# Patient Record
Sex: Female | Born: 1960 | Race: White | Hispanic: No | Marital: Single | Smoking: Never smoker
Health system: Southern US, Community
[De-identification: ages and names within clinical notes are randomized; demographics above are authoritative.]

---

## 2004-07-27 DIAGNOSIS — G4700 Insomnia, unspecified: Secondary | ICD-10-CM | POA: Insufficient documentation

## 2004-07-27 DIAGNOSIS — D649 Anemia, unspecified: Secondary | ICD-10-CM | POA: Insufficient documentation

## 2004-07-27 DIAGNOSIS — G43909 Migraine, unspecified, not intractable, without status migrainosus: Secondary | ICD-10-CM | POA: Insufficient documentation

## 2004-07-27 DIAGNOSIS — M797 Fibromyalgia: Secondary | ICD-10-CM | POA: Insufficient documentation

## 2005-10-21 DIAGNOSIS — L732 Hidradenitis suppurativa: Secondary | ICD-10-CM | POA: Insufficient documentation

## 2009-01-02 DIAGNOSIS — E559 Vitamin D deficiency, unspecified: Secondary | ICD-10-CM | POA: Insufficient documentation

## 2009-01-02 DIAGNOSIS — J45909 Unspecified asthma, uncomplicated: Secondary | ICD-10-CM | POA: Insufficient documentation

## 2009-01-02 DIAGNOSIS — E782 Mixed hyperlipidemia: Secondary | ICD-10-CM | POA: Insufficient documentation

## 2010-09-21 DIAGNOSIS — M170 Bilateral primary osteoarthritis of knee: Secondary | ICD-10-CM | POA: Insufficient documentation

## 2012-12-03 DIAGNOSIS — K2100 Gastro-esophageal reflux disease with esophagitis, without bleeding: Secondary | ICD-10-CM | POA: Insufficient documentation

## 2013-09-16 DIAGNOSIS — F411 Generalized anxiety disorder: Secondary | ICD-10-CM | POA: Insufficient documentation

## 2015-07-03 DIAGNOSIS — J309 Allergic rhinitis, unspecified: Secondary | ICD-10-CM | POA: Insufficient documentation

## 2015-11-17 DIAGNOSIS — E669 Obesity, unspecified: Secondary | ICD-10-CM | POA: Insufficient documentation

## 2016-06-21 DIAGNOSIS — R600 Localized edema: Secondary | ICD-10-CM | POA: Insufficient documentation

## 2016-06-21 DIAGNOSIS — M542 Cervicalgia: Secondary | ICD-10-CM | POA: Insufficient documentation

## 2016-06-21 DIAGNOSIS — K219 Gastro-esophageal reflux disease without esophagitis: Secondary | ICD-10-CM | POA: Insufficient documentation

## 2016-06-21 DIAGNOSIS — D229 Melanocytic nevi, unspecified: Secondary | ICD-10-CM | POA: Insufficient documentation

## 2016-06-21 DIAGNOSIS — F32A Depression, unspecified: Secondary | ICD-10-CM | POA: Insufficient documentation

## 2016-06-21 DIAGNOSIS — I10 Essential (primary) hypertension: Secondary | ICD-10-CM | POA: Insufficient documentation

## 2016-10-07 ENCOUNTER — Ambulatory Visit (INDEPENDENT_AMBULATORY_CARE_PROVIDER_SITE_OTHER): Payer: BLUE CROSS/BLUE SHIELD | Admitting: Sports Medicine

## 2016-10-07 ENCOUNTER — Telehealth: Payer: Self-pay | Admitting: Sports Medicine

## 2016-10-07 DIAGNOSIS — M170 Bilateral primary osteoarthritis of knee: Secondary | ICD-10-CM | POA: Diagnosis not present

## 2016-10-07 DIAGNOSIS — Z9884 Bariatric surgery status: Secondary | ICD-10-CM

## 2016-10-07 MED ORDER — IBUPROFEN 600 MG PO TABS: 600.0000 mg | ORAL_TABLET | Freq: Three times a day (TID) | ORAL | 3 refills | Status: DC | PRN

## 2016-10-07 MED ORDER — IBUPROFEN-FAMOTIDINE 800-26.6 MG PO TABS: 1.0000 | ORAL_TABLET | Freq: Three times a day (TID) | ORAL | 3 refills | Status: DC

## 2016-10-07 MED ORDER — MENTHOL (TOPICAL ANALGESIC) 10 % EX LIQD: CUTANEOUS | 11 refills | Status: DC

## 2016-10-07 MED ORDER — MUSCLE RUB 10-15 % EX CREA: 1.0000 "application " | TOPICAL_CREAM | CUTANEOUS | 11 refills | Status: DC | PRN

## 2016-10-07 NOTE — Assessment & Plan Note (Signed)
She also has morbid obesity, she has a LAP-BAND that is not currently being used, I would like to get her into our general surgeons to see if they can get it refilled.

## 2016-10-07 NOTE — Assessment & Plan Note (Addendum)
Bilateral steroid injections. Duexis We will get her approved for Visco supplementation. She also has morbid obesity, she has a LAP-BAND that is not currently being used, I would like to get her into our general surgeons to see if they can get it refilled.

## 2016-10-07 NOTE — Telephone Encounter (Signed)
Submitted for approval on Orthovisc. Awaiting confirmation.  

## 2016-10-07 NOTE — Progress Notes (Signed)
Subjective:    I'm seeing this patient as a consultation for:  Dr. Elenor Quinones  CC: Bilateral knee pain  HPI: This is a pleasant 56 year old female, she has known an x-ray confirmed bilateral knee osteoarthritis, she has had steroid injections and viscosupplementation in the past which have worked well. She is looking for relief, she would like to go through with viscous supplementation again. She does have a LAP-BAND, but it has not been filled in a long time.  Past medical history:  Negative.  See flowsheet/record as well for more information.  Surgical history: Negative.  See flowsheet/record as well for more information.  Family history: Negative.  See flowsheet/record as well for more information.  Social history: Negative.  See flowsheet/record as well for more information.  Allergies, and medications have been entered into the medical record, reviewed, and no changes needed.   Review of Systems: No headache, visual changes, nausea, vomiting, diarrhea, constipation, dizziness, abdominal pain, skin rash, fevers, chills, night sweats, weight loss, swollen lymph nodes, body aches, joint swelling, muscle aches, chest pain, shortness of breath, mood changes, visual or auditory hallucinations.   Objective:   General: Well Developed, well nourished, and in no acute distress.  Neuro/Psych: Alert and oriented x3, extra-ocular muscles intact, able to move all 4 extremities, sensation grossly intact. Skin: Warm and dry, no rashes noted.  Respiratory: Not using accessory muscles, speaking in full sentences, trachea midline.  Cardiovascular: Pulses palpable, no extremity edema. Abdomen: Does not appear distended. Bilateral knees: Normal to inspection with no erythema or effusion or obvious bony abnormalities. Tender to palpation the medial joint line bilaterally ROM normal in flexion and extension and lower leg rotation. Ligaments with solid consistent endpoints including ACL, PCL,  LCL, MCL. Negative Mcmurray's and provocative meniscal tests. Non painful patellar compression. Patellar and quadriceps tendons unremarkable. Hamstring and quadriceps strength is normal.  Procedure: Real-time Ultrasound Guided Injection of left knee Device: GE Logiq E  Verbal informed consent obtained.  Time-out conducted.  Noted no overlying erythema, induration, or other signs of local infection.  Skin prepped in a sterile fashion.  Local anesthesia: Topical Ethyl chloride.  With sterile technique and under real time ultrasound guidance:  1 mL kenalog 40, 2 mL lidocaine, 2 mL Marcaine injected easily. Completed without difficulty  Pain immediately resolved suggesting accurate placement of the medication.  Advised to call if fevers/chills, erythema, induration, drainage, or persistent bleeding.  Images permanently stored and available for review in the ultrasound unit.  Impression: Technically successful ultrasound guided injection.  Procedure: Real-time Ultrasound Guided Injection of right knee Device: GE Logiq E  Verbal informed consent obtained.  Time-out conducted.  Noted no overlying erythema, induration, or other signs of local infection.  Skin prepped in a sterile fashion.  Local anesthesia: Topical Ethyl chloride.  With sterile technique and under real time ultrasound guidance:  1 mL kenalog 40, 2 mL lidocaine, 2 mL Marcaine injected easily. Completed without difficulty  Pain immediately resolved suggesting accurate placement of the medication.  Advised to call if fevers/chills, erythema, induration, drainage, or persistent bleeding.  Images permanently stored and available for review in the ultrasound unit.  Impression: Technically successful ultrasound guided injection.  Impression and Recommendations:   This case required medical decision making of moderate complexity.  Primary osteoarthritis of both knees Bilateral steroid injections. Duexis We will get her  approved for Visco supplementation. She also has morbid obesity, she has a LAP-BAND that is not currently being used, I would  like to get her into our general surgeons to see if they can get it refilled.  LAP-BAND surgery status She also has morbid obesity, she has a LAP-BAND that is not currently being used, I would like to get her into our general surgeons to see if they can get it refilled.

## 2016-10-07 NOTE — Telephone Encounter (Signed)
-----   Message from Silverio Decamp, MD sent at 10/07/2016  2:16 PM EST ----- Boogs, Bilateral Visco approval, x-ray confirmed arthritis, good response to previous series of viscous supplementation. ___________________________________________ Gwen Her. Dianah Field, M.D., ABFM., CAQSM. Primary Care and [place] Instructor of [place] of [facility] of Medicine

## 2016-10-10 ENCOUNTER — Other Ambulatory Visit: Payer: Self-pay

## 2016-10-10 DIAGNOSIS — M170 Bilateral primary osteoarthritis of knee: Secondary | ICD-10-CM

## 2016-10-10 MED ORDER — MUSCLE RUB 10-15 % EX CREA: 1.0000 "application " | TOPICAL_CREAM | CUTANEOUS | 11 refills | Status: AC | PRN

## 2016-10-10 MED ORDER — MENTHOL (TOPICAL ANALGESIC) 10 % EX LIQD: CUTANEOUS | 11 refills | Status: AC

## 2016-11-03 ENCOUNTER — Telehealth: Payer: Self-pay | Admitting: Sports Medicine

## 2016-11-03 NOTE — Telephone Encounter (Signed)
Spoke with Pt and scheduled first injection.

## 2016-11-03 NOTE — Telephone Encounter (Signed)
Received the following information from OV benefits investigation:   Orthovisc is covered. There is an injection copay of $100.00 and a separate office visit copay of $20 if billed as primary or $40 if billed as specialist visit which will be applied towards the OOP max of $6600.00 in which $338.78 has been met. Deductibles and Co-Insurance do not apply. [name] is allowed. Call reference number is [id number].  Pt called and scheduled.

## 2016-11-03 NOTE — Telephone Encounter (Signed)
Patient called adv that she has left messages with Dr. Darene Lamer nurse regarding her approval for Vaughan Browner and she has not heard anything back. I adv pt that Triage nurse [facility] handles those approvals and will send a message for Kelsi to call her back today to discuss information. Pt was very upset states she is in pain and would like to know what insurance has adv our office but I explained to her that Dr. Mcneil Sober nurse is not the person who takes care of that information, pt understood. Thanks

## 2016-11-04 ENCOUNTER — Ambulatory Visit (INDEPENDENT_AMBULATORY_CARE_PROVIDER_SITE_OTHER): Payer: BLUE CROSS/BLUE SHIELD | Admitting: Sports Medicine

## 2016-11-04 VITALS — BP 124/84 | HR 91 | Resp 18 | Wt 248.2 lb

## 2016-11-04 DIAGNOSIS — M170 Bilateral primary osteoarthritis of knee: Secondary | ICD-10-CM | POA: Diagnosis not present

## 2016-11-04 NOTE — Progress Notes (Signed)

## 2016-11-04 NOTE — Assessment & Plan Note (Signed)
Orthovisc injection #1 of 4 into both knees, return in one week for #2 of 4.

## 2016-11-04 NOTE — Telephone Encounter (Signed)
Pt came into clinic today for first bilat OV injection. At check in, Pt states she "changed insurance to a better plan with [employer]." Advised Pt the benefit quote that was given may not still be valid, Pt verbalized understanding and would like to proceed with injections. Physician advised.

## 2016-11-08 ENCOUNTER — Telehealth: Payer: Self-pay

## 2016-11-08 MED ORDER — DICLOFENAC SODIUM 2 % TD SOLN: 2.0000 | Freq: Two times a day (BID) | TRANSDERMAL | 11 refills | Status: DC

## 2016-11-08 NOTE — Telephone Encounter (Signed)
Pharmacy called stating pt is unable to take pills because they are too large. Pharmacy would like to know if pt can have Pennsaid instead. Please advise.

## 2016-11-08 NOTE — Telephone Encounter (Signed)
Done

## 2016-11-11 ENCOUNTER — Ambulatory Visit (INDEPENDENT_AMBULATORY_CARE_PROVIDER_SITE_OTHER): Payer: BLUE CROSS/BLUE SHIELD | Admitting: Sports Medicine

## 2016-11-11 DIAGNOSIS — M170 Bilateral primary osteoarthritis of knee: Secondary | ICD-10-CM | POA: Diagnosis not present

## 2016-11-11 NOTE — Progress Notes (Signed)

## 2016-11-11 NOTE — Assessment & Plan Note (Signed)
Orthovisc injection #2 of 4 into both knees, return in one week for #3 of 4. 

## 2016-11-21 ENCOUNTER — Ambulatory Visit (INDEPENDENT_AMBULATORY_CARE_PROVIDER_SITE_OTHER): Payer: BLUE CROSS/BLUE SHIELD | Admitting: Sports Medicine

## 2016-11-21 DIAGNOSIS — M170 Bilateral primary osteoarthritis of knee: Secondary | ICD-10-CM

## 2016-11-21 NOTE — Progress Notes (Signed)

## 2016-11-21 NOTE — Assessment & Plan Note (Signed)
Orthovisc injection #3 of 4 into both knees, return in one week for orthovisc #4 of 4

## 2016-11-28 ENCOUNTER — Ambulatory Visit (INDEPENDENT_AMBULATORY_CARE_PROVIDER_SITE_OTHER): Payer: BLUE CROSS/BLUE SHIELD | Admitting: Sports Medicine

## 2016-11-28 VITALS — BP 108/66 | HR 79 | Resp 16 | Wt 239.8 lb

## 2016-11-28 DIAGNOSIS — M170 Bilateral primary osteoarthritis of knee: Secondary | ICD-10-CM | POA: Diagnosis not present

## 2016-11-28 NOTE — Assessment & Plan Note (Signed)
Orthovisc injection #4 of 4 into both knees, return as needed. 

## 2016-11-28 NOTE — Progress Notes (Signed)

## 2016-12-26 ENCOUNTER — Ambulatory Visit (INDEPENDENT_AMBULATORY_CARE_PROVIDER_SITE_OTHER): Payer: BLUE CROSS/BLUE SHIELD | Admitting: Sports Medicine

## 2016-12-26 DIAGNOSIS — M170 Bilateral primary osteoarthritis of knee: Secondary | ICD-10-CM

## 2016-12-26 NOTE — Assessment & Plan Note (Signed)
Doing extremely well after Orthovisc into both knees, also doing well with Duexis and topical Pennsaid. Return as needed.

## 2016-12-26 NOTE — Progress Notes (Signed)
  Subjective:    CC: Follow-up  HPI: Bilateral knee osteoarthritis: Pain is resolved.  Past medical history:  Negative.  See flowsheet/record as well for more information.  Surgical history: Negative.  See flowsheet/record as well for more information.  Family history: Negative.  See flowsheet/record as well for more information.  Social history: Negative.  See flowsheet/record as well for more information.  Allergies, and medications have been entered into the medical record, reviewed, and no changes needed.   Review of Systems: No fevers, chills, night sweats, weight loss, chest pain, or shortness of breath.   Objective:    General: Well Developed, well nourished, and in no acute distress.  Neuro: Alert and oriented x3, extra-ocular muscles intact, sensation grossly intact.  HEENT: Normocephalic, atraumatic, pupils equal round reactive to light, neck supple, no masses, no lymphadenopathy, thyroid nonpalpable.  Skin: Warm and dry, no rashes. Cardiac: Regular rate and rhythm, no murmurs rubs or gallops, no lower extremity edema.  Respiratory: Clear to auscultation bilaterally. Not using accessory muscles, speaking in full sentences.  Impression and Recommendations:    Primary osteoarthritis of both knees Doing extremely well after Orthovisc into both knees, also doing well with Duexis and topical Pennsaid. Return as needed.

## 2017-02-03 ENCOUNTER — Other Ambulatory Visit: Payer: Self-pay | Admitting: Sports Medicine

## 2017-02-03 DIAGNOSIS — M170 Bilateral primary osteoarthritis of knee: Secondary | ICD-10-CM

## 2017-03-10 ENCOUNTER — Other Ambulatory Visit: Payer: Self-pay | Admitting: Sports Medicine

## 2017-03-10 DIAGNOSIS — M170 Bilateral primary osteoarthritis of knee: Secondary | ICD-10-CM

## 2018-02-05 DIAGNOSIS — E039 Hypothyroidism, unspecified: Secondary | ICD-10-CM | POA: Insufficient documentation

## 2018-03-31 ENCOUNTER — Other Ambulatory Visit: Payer: Self-pay | Admitting: Sports Medicine

## 2018-03-31 DIAGNOSIS — M170 Bilateral primary osteoarthritis of knee: Secondary | ICD-10-CM

## 2019-10-11 ENCOUNTER — Ambulatory Visit (INDEPENDENT_AMBULATORY_CARE_PROVIDER_SITE_OTHER): Payer: 59

## 2019-10-11 ENCOUNTER — Ambulatory Visit: Payer: 59 | Admitting: Sports Medicine

## 2019-10-11 ENCOUNTER — Encounter: Payer: Self-pay | Admitting: Sports Medicine

## 2019-10-11 ENCOUNTER — Other Ambulatory Visit: Payer: Self-pay

## 2019-10-11 DIAGNOSIS — M170 Bilateral primary osteoarthritis of knee: Secondary | ICD-10-CM

## 2019-10-11 MED ORDER — DICLOFENAC SODIUM 2 % EX SOLN: 2.0000 | Freq: Two times a day (BID) | CUTANEOUS | 11 refills | Status: AC

## 2019-10-11 NOTE — Assessment & Plan Note (Signed)
I have not seen Jillian Morales in a long time, she did well after Orthovisc as well as Duexis and with Pennsaid (diclofenac 2% topical) approximately 3 years ago. A night ago she rolled over in bed and felt a severe pain on her lateral left knee. I think she has a degenerative meniscal tear, no acute current mechanical symptoms. Injection today with steroid. I am going to give her some Pennsaid samples. Updated x-rays of both knees. Return to see me in 1 month.

## 2019-10-11 NOTE — Progress Notes (Signed)
    Procedures performed today:    Procedure: Real-time Ultrasound Guided injection of the left knee Device: Samsung HS60  Verbal informed consent obtained.  Time-out conducted.  Noted no overlying erythema, induration, or other signs of local infection.  Skin prepped in a sterile fashion.  Local anesthesia: Topical Ethyl chloride.  With sterile technique and under real time ultrasound guidance: 1 cc Kenalog 40, 2 cc lidocaine, 2 cc bupivacaine injected easily Completed without difficulty  Pain immediately resolved suggesting accurate placement of the medication.  Advised to call if fevers/chills, erythema, induration, drainage, or persistent bleeding.  Images permanently stored and available for review in the ultrasound unit.  Impression: Technically successful ultrasound guided injection.  Independent interpretation of tests performed by another provider:   None.  Impression and Recommendations:    Primary osteoarthritis of both knees I have not seen Emmelia in a long time, she did well after Orthovisc as well as Duexis and with Pennsaid (diclofenac 2% topical) approximately 3 years ago. A night ago she rolled over in bed and felt a severe pain on her lateral left knee. I think she has a degenerative meniscal tear, no acute current mechanical symptoms. Injection today with steroid. I am going to give her some Pennsaid samples. Updated x-rays of both knees. Return to see me in 1 month.    ___________________________________________ Gwen Her. Dianah Field, M.D., ABFM., CAQSM. Primary Care and [place] Instructor of [place] of [facility] of Medicine

## 2019-10-15 ENCOUNTER — Telehealth: Payer: Self-pay | Admitting: Sports Medicine

## 2019-10-15 NOTE — Telephone Encounter (Signed)
Received fax for Pennsaid sent through cover my meds waiting on determination. - CF

## 2019-10-21 NOTE — Telephone Encounter (Signed)
Received fax from [facility] they denied coverage on Pennsaid due to not receiving all clinical information to see if medically necessary. Placing in providers box for review. - CF

## 2019-11-08 ENCOUNTER — Ambulatory Visit: Payer: 59 | Admitting: Sports Medicine

## 2019-11-14 ENCOUNTER — Ambulatory Visit (INDEPENDENT_AMBULATORY_CARE_PROVIDER_SITE_OTHER): Payer: 59 | Admitting: Sports Medicine

## 2019-11-14 ENCOUNTER — Other Ambulatory Visit: Payer: Self-pay

## 2019-11-14 DIAGNOSIS — M170 Bilateral primary osteoarthritis of knee: Secondary | ICD-10-CM | POA: Diagnosis not present

## 2019-11-14 NOTE — Progress Notes (Signed)
    Procedures performed today:    Procedure: Real-time Ultrasound Guided injection of the right knee Device: Samsung HS60  Verbal informed consent obtained.  Time-out conducted.  Noted no overlying erythema, induration, or other signs of local infection.  Skin prepped in a sterile fashion.  Local anesthesia: Topical Ethyl chloride.  With sterile technique and under real time ultrasound guidance: 1 cc Kenalog 40, 2 cc lidocaine, 2 cc bupivacaine injected easily Completed without difficulty  Pain immediately resolved suggesting accurate placement of the medication.  Advised to call if fevers/chills, erythema, induration, drainage, or persistent bleeding.  Images permanently stored and available for review in the ultrasound unit.  Impression: Technically successful ultrasound guided injection.  Independent interpretation of notes and tests performed by another provider:   None.  Brief History, Exam, Impression, and Recommendations:    Primary osteoarthritis of both knees Kellis returns, she did extremely well after a left knee injection at the last visit. She is pain-free, now has similar pain in her right knee and would like an injection, this was performed today. She can continue Pennsaid (diclofenac 2% topical). She has a vacation coming up in the fall and would like to finish course of a series before then but she will let us know.    ___________________________________________ Gwen Her. Dianah Field, M.D., ABFM., CAQSM. Primary Care and [place] Instructor of [place] of [facility] of Medicine

## 2019-11-14 NOTE — Assessment & Plan Note (Signed)
Jillian Morales returns, she did extremely well after a left knee injection at the last visit. She is pain-free, now has similar pain in her right knee and would like an injection, this was performed today. She can continue Pennsaid (diclofenac 2% topical). She has a vacation coming up in the fall and would like to finish course of a series before then but she will let us know.

## 2020-01-29 ENCOUNTER — Telehealth: Payer: Self-pay | Admitting: *Deleted

## 2020-01-29 NOTE — Telephone Encounter (Signed)
Routing to [place] to get Orthovisc approved again.  Bilateral, did well in the past, x-ray confirmed and failed steroid injections and NSAIDs.

## 2020-01-29 NOTE — Telephone Encounter (Signed)
Pt left vm wanting to do Orthovisc again.

## 2020-01-29 NOTE — Telephone Encounter (Signed)
Started Orthovisc approval and placed PA form in providers box for signature. - CF

## 2020-01-30 NOTE — Telephone Encounter (Signed)
Faxed PA form to [facility] waiting on determination. - CF

## 2020-03-12 NOTE — Telephone Encounter (Signed)
I called [facility] and spoke with Doren Custard. I had him do the authorization over the phone and we got this approved in about 30 minutes. After speaking with the representative the prior authorization was not sent and there had been no record of the one that had been sent initially in June. The approval is good for 1 year. I am going to send a copy of the approval to scan. FYI. I will call the patient to set up an appointment.   The authorization has been approved after almost 30 minutes on the phone with the representative. The call reference is [id number].

## 2020-03-12 NOTE — Telephone Encounter (Signed)
Patient wanted to know what the status of this approval was. She states its been over a month and no one has contacted her about this.

## 2020-03-12 NOTE — Telephone Encounter (Signed)
I called patient and she has a visit next week with Dr. Darene Lamer to start the Orthovisc injections. No other concerns.

## 2020-03-13 NOTE — Telephone Encounter (Signed)
Jillian Morales!  Killing it!!!

## 2020-03-17 ENCOUNTER — Ambulatory Visit: Payer: 59 | Admitting: Sports Medicine

## 2020-03-30 ENCOUNTER — Other Ambulatory Visit: Payer: Self-pay

## 2020-03-30 ENCOUNTER — Ambulatory Visit (INDEPENDENT_AMBULATORY_CARE_PROVIDER_SITE_OTHER): Payer: 59 | Admitting: Sports Medicine

## 2020-03-30 DIAGNOSIS — M170 Bilateral primary osteoarthritis of knee: Secondary | ICD-10-CM

## 2020-03-30 NOTE — Assessment & Plan Note (Signed)
Orthovisc #1 of 4 both knees, return in 1 week for number 2 of 4. Ok to double book her somewhere.

## 2020-03-30 NOTE — Progress Notes (Signed)
    Procedures performed today:    Procedure: Real-time Ultrasound Guided injection of the left knee Device: Samsung HS60  Verbal informed consent obtained.  Time-out conducted.  Noted no overlying erythema, induration, or other signs of local infection.  Skin prepped in a sterile fashion.  Local anesthesia: Topical Ethyl chloride.  With sterile technique and under real time ultrasound guidance:  30 mg/2 mL of OrthoVisc (sodium hyaluronate) in a prefilled syringe was injected easily into the knee through a 22-gauge needle.   Completed without difficulty  Pain immediately resolved suggesting accurate placement of the medication.  Advised to call if fevers/chills, erythema, induration, drainage, or persistent bleeding.  Images permanently stored and available for review in the ultrasound unit.  Impression: Technically successful ultrasound guided injection.  Procedure: Real-time Ultrasound Guided injection of the right knee Device: Samsung HS60  Verbal informed consent obtained.  Time-out conducted.  Noted no overlying erythema, induration, or other signs of local infection.  Skin prepped in a sterile fashion.  Local anesthesia: Topical Ethyl chloride.  With sterile technique and under real time ultrasound guidance:  30 mg/2 mL of OrthoVisc (sodium hyaluronate) in a prefilled syringe was injected easily into the knee through a 22-gauge needle.   Completed without difficulty  Pain immediately resolved suggesting accurate placement of the medication.  Advised to call if fevers/chills, erythema, induration, drainage, or persistent bleeding.  Images permanently stored and available for review in the ultrasound unit.  Impression: Technically successful ultrasound guided injection.  Independent interpretation of notes and tests performed by another provider:   None.  Brief History, Exam, Impression, and Recommendations:    Primary osteoarthritis of both knees Orthovisc #1 of 4 both  knees, return in 1 week for number 2 of 4. Ok to double book her somewhere.    ___________________________________________ Gwen Her. Dianah Field, M.D., ABFM., CAQSM. Primary Care and [place] Instructor of [place] of [facility] of Medicine

## 2020-04-07 ENCOUNTER — Ambulatory Visit (INDEPENDENT_AMBULATORY_CARE_PROVIDER_SITE_OTHER): Payer: 59 | Admitting: Sports Medicine

## 2020-04-07 DIAGNOSIS — M170 Bilateral primary osteoarthritis of knee: Secondary | ICD-10-CM

## 2020-04-07 NOTE — Assessment & Plan Note (Signed)
Orthovisc No. 2 of 4 into both knees, return in 1 week for #3

## 2020-04-07 NOTE — Progress Notes (Signed)
    Procedures performed today:    Procedure: Real-time Ultrasound Guidedinjection of the left knee Device: Samsung HS60  Verbal informed consent obtained.  Time-out conducted.  Noted no overlying erythema, induration, or other signs of local infection.  Skin prepped in a sterile fashion.  Local anesthesia: Topical Ethyl chloride.  With sterile technique and under real time ultrasound guidance: 30 mg/2 mL of OrthoVisc (sodium hyaluronate) in a prefilled syringe was injected easily into the knee through a 22-gauge needle.   Completed without difficulty  Pain immediately resolved suggesting accurate placement of the medication.  Advised to call if fevers/chills, erythema, induration, drainage, or persistent bleeding.  Images permanently stored and available for review in the ultrasound unit.  Impression: Technically successful ultrasound guided injection.  Procedure: Real-time Ultrasound Guidedinjection of the right knee Device: Samsung HS60  Verbal informed consent obtained.  Time-out conducted.  Noted no overlying erythema, induration, or other signs of local infection.  Skin prepped in a sterile fashion.  Local anesthesia: Topical Ethyl chloride.  With sterile technique and under real time ultrasound guidance: 30 mg/2 mL of OrthoVisc (sodium hyaluronate) in a prefilled syringe was injected easily into the knee through a 22-gauge needle.   Completed without difficulty  Pain immediately resolved suggesting accurate placement of the medication.  Advised to call if fevers/chills, erythema, induration, drainage, or persistent bleeding.  Images permanently stored and available for review in the ultrasound unit.  Impression: Technically successful ultrasound guided injection.  Independent interpretation of notes and tests performed by another provider:   None.  Brief History, Exam, Impression, and Recommendations:    Primary osteoarthritis of both knees Orthovisc No. 2 of 4 into  both knees, return in 1 week for #3    ___________________________________________ Gwen Her. Dianah Field, M.D., ABFM., CAQSM. Primary Care and [place] Instructor of [place] of [facility] of Medicine

## 2020-04-16 ENCOUNTER — Ambulatory Visit (INDEPENDENT_AMBULATORY_CARE_PROVIDER_SITE_OTHER): Payer: 59 | Admitting: Sports Medicine

## 2020-04-16 ENCOUNTER — Ambulatory Visit (INDEPENDENT_AMBULATORY_CARE_PROVIDER_SITE_OTHER): Payer: 59

## 2020-04-16 DIAGNOSIS — M170 Bilateral primary osteoarthritis of knee: Secondary | ICD-10-CM

## 2020-04-16 NOTE — Assessment & Plan Note (Signed)
Orthovisc No. 3 of 4 into both knees, return in 1 week for #4 of 4.

## 2020-04-16 NOTE — Progress Notes (Signed)
° ° °  Procedures performed today:    Procedure: Real-time Ultrasound Guidedinjection of theleft knee Device: Samsung HS60  Verbal informed consent obtained.  Time-out conducted.  Noted no overlying erythema, induration, or other signs of local infection.  Skin prepped in a sterile fashion.  Local anesthesia: Topical Ethyl chloride.  With sterile technique and under real time ultrasound guidance:30 mg/2 mL of OrthoVisc (sodium hyaluronate) in a prefilled syringe was injected easily into the knee through a 22-gauge needle.  Completed without difficulty  Pain immediately resolved suggesting accurate placement of the medication.  Advised to call if fevers/chills, erythema, induration, drainage, or persistent bleeding.  Images permanently stored and available for review in the ultrasound unit.  Impression: Technically successful ultrasound guided injection.  Procedure: Real-time Ultrasound Guidedinjection of theright knee Device: Samsung HS60  Verbal informed consent obtained.  Time-out conducted.  Noted no overlying erythema, induration, or other signs of local infection.  Skin prepped in a sterile fashion.  Local anesthesia: Topical Ethyl chloride.  With sterile technique and under real time ultrasound guidance:30 mg/2 mL of OrthoVisc (sodium hyaluronate) in a prefilled syringe was injected easily into the knee through a 22-gauge needle.  Completed without difficulty  Pain immediately resolved suggesting accurate placement of the medication.  Advised to call if fevers/chills, erythema, induration, drainage, or persistent bleeding.  Images permanently stored and available for review in the ultrasound unit.  Impression: Technically successful ultrasound guided injection.  Independent interpretation of notes and tests performed by another provider:   None.  Brief History, Exam, Impression, and Recommendations:    Primary osteoarthritis of both knees Orthovisc No. 3 of 4 into  both knees, return in 1 week for #4 of 4.    ___________________________________________ Gwen Her. Dianah Field, M.D., ABFM., CAQSM. Primary Care and [place] Instructor of [place] of [facility] of Medicine

## 2020-04-29 ENCOUNTER — Ambulatory Visit (INDEPENDENT_AMBULATORY_CARE_PROVIDER_SITE_OTHER): Payer: 59 | Admitting: Sports Medicine

## 2020-04-29 ENCOUNTER — Ambulatory Visit (INDEPENDENT_AMBULATORY_CARE_PROVIDER_SITE_OTHER): Payer: 59

## 2020-04-29 DIAGNOSIS — M170 Bilateral primary osteoarthritis of knee: Secondary | ICD-10-CM

## 2020-04-29 NOTE — Progress Notes (Signed)
    Procedures performed today:    Procedure: Real-time Ultrasound Guidedinjection of theleft knee Device: Samsung HS60  Verbal informed consent obtained.  Time-out conducted.  Noted no overlying erythema, induration, or other signs of local infection.  Skin prepped in a sterile fashion.  Local anesthesia: Topical Ethyl chloride.  With sterile technique and under real time ultrasound guidance:30 mg/2 mL of OrthoVisc (sodium hyaluronate) in a prefilled syringe was injected easily into the knee through a 22-gauge needle.  Completed without difficulty  Pain immediately resolved suggesting accurate placement of the medication.  Advised to call if fevers/chills, erythema, induration, drainage, or persistent bleeding.  Images permanently stored and available for review in the ultrasound unit.  Impression: Technically successful ultrasound guided injection.  Procedure: Real-time Ultrasound Guidedinjection of theright knee Device: Samsung HS60  Verbal informed consent obtained.  Time-out conducted.  Noted no overlying erythema, induration, or other signs of local infection.  Skin prepped in a sterile fashion.  Local anesthesia: Topical Ethyl chloride.  With sterile technique and under real time ultrasound guidance:30 mg/2 mL of OrthoVisc (sodium hyaluronate) in a prefilled syringe was injected easily into the knee through a 22-gauge needle.  Completed without difficulty  Pain immediately resolved suggesting accurate placement of the medication.  Advised to call if fevers/chills, erythema, induration, drainage, or persistent bleeding.  Images permanently stored and available for review in the ultrasound unit.  Impression: Technically successful ultrasound guided injection.  Independent interpretation of notes and tests performed by another provider:   None.  Brief History, Exam, Impression, and Recommendations:    Primary osteoarthritis of both knees Already feeling a lot  better. Orthovisc No. 4 of 4 both knees, return in 1 month as needed.    ___________________________________________ Gwen Her. Dianah Field, M.D., ABFM., CAQSM. Primary Care and [place] Instructor of [employer] [facility] of Medicine

## 2020-04-29 NOTE — Assessment & Plan Note (Addendum)
Already feeling a lot better. Orthovisc No. 4 of 4 both knees, return in 1 month as needed.

## 2021-03-09 ENCOUNTER — Ambulatory Visit (INDEPENDENT_AMBULATORY_CARE_PROVIDER_SITE_OTHER): Payer: 59 | Admitting: Sports Medicine

## 2021-03-09 ENCOUNTER — Ambulatory Visit (INDEPENDENT_AMBULATORY_CARE_PROVIDER_SITE_OTHER): Payer: 59

## 2021-03-09 ENCOUNTER — Other Ambulatory Visit: Payer: Self-pay

## 2021-03-09 DIAGNOSIS — M25571 Pain in right ankle and joints of right foot: Secondary | ICD-10-CM | POA: Diagnosis not present

## 2021-03-09 DIAGNOSIS — G8929 Other chronic pain: Secondary | ICD-10-CM | POA: Diagnosis not present

## 2021-03-09 NOTE — Assessment & Plan Note (Signed)
This is a pleasant 60 year old female, she has had several months of pain in her right ankle, localized at the sinus tarsi, she likes to wear flip-flops. She does note that when she wears athletic shoes her pain improves considerably. She is not interested in bracing, and she does take occasional NSAIDs. Adding topical Pennsaid (diclofenac 2% topical) samples, she will wear her athletic shoes the majority of the time, I would like some x-rays, if insufficient improvement after a month or 6 weeks we will do an injection.

## 2021-03-09 NOTE — Progress Notes (Signed)
    Procedures performed today:    None.  Independent interpretation of notes and tests performed by another provider:   None.  Brief History, Exam, Impression, and Recommendations:    Chronic pain of right ankle This is a pleasant 60 year old female, she has had several months of pain in her right ankle, localized at the sinus tarsi, she likes to wear flip-flops. She does note that when she wears athletic shoes her pain improves considerably. She is not interested in bracing, and she does take occasional NSAIDs. Adding topical Pennsaid (diclofenac 2% topical) samples, she will wear her athletic shoes the majority of the time, I would like some x-rays, if insufficient improvement after a month or 6 weeks we will do an injection.    ___________________________________________ Gwen Her. Dianah Field, M.D., ABFM., CAQSM. Primary Care and [place] Instructor of [place] of [facility] of Medicine

## 2021-04-21 ENCOUNTER — Ambulatory Visit (INDEPENDENT_AMBULATORY_CARE_PROVIDER_SITE_OTHER): Payer: 59 | Admitting: Sports Medicine

## 2021-04-21 ENCOUNTER — Other Ambulatory Visit: Payer: Self-pay

## 2021-04-21 DIAGNOSIS — M25571 Pain in right ankle and joints of right foot: Secondary | ICD-10-CM

## 2021-04-21 DIAGNOSIS — G8929 Other chronic pain: Secondary | ICD-10-CM

## 2021-04-21 MED ORDER — CELECOXIB 100 MG PO CAPS: 100.0000 mg | ORAL_CAPSULE | Freq: Two times a day (BID) | ORAL | 3 refills | Status: DC

## 2021-04-21 NOTE — Progress Notes (Signed)
    Procedures performed today:    None.  Independent interpretation of notes and tests performed by another provider:   None.  Brief History, Exam, Impression, and Recommendations:    Chronic pain of right ankle Jillian Morales is a very pleasant 60 year old female, she has had several months of pain in the right ankle, localized at the sinus tarsi. We discussed bracing, she has been doing an occasional ibuprofen. She has improved to some degree but still has significant pain after about 2 miles of walking, we will switch her brace out for a full ASO, I will switch her from ibuprofen to Celebrex relatively low-dose, she can return to see me on an as-needed basis, the next step would be a sinus tarsi type injection. I have also encouraged regular athletic footwear.  Chronic process not at goal with pharmacologic intervention.  ___________________________________________ Gwen Her. Dianah Field, M.D., ABFM., CAQSM. Primary Care and [place] Instructor of [place] of [facility] of Medicine

## 2021-04-21 NOTE — Assessment & Plan Note (Signed)
Jillian Morales is a very pleasant 60 year old female, she has had several months of pain in the right ankle, localized at the sinus tarsi. We discussed bracing, she has been doing an occasional ibuprofen. She has improved to some degree but still has significant pain after about 2 miles of walking, we will switch her brace out for a full ASO, I will switch her from ibuprofen to Celebrex relatively low-dose, she can return to see me on an as-needed basis, the next step would be a sinus tarsi type injection. I have also encouraged regular athletic footwear.

## 2021-04-22 ENCOUNTER — Telehealth: Payer: Self-pay

## 2021-04-22 NOTE — Telephone Encounter (Signed)
Medication: celecoxib (CELEBREX) 100 MG capsule Prior authorization determination received Medication has been denied Reason for denial: This request was denied because you did not meet the following clinical requirements: The requested medication and/or diagnosis are not a covered benefit and excluded from coverage in accordance with the terms and conditions of your plan benefit. Therefore, the request has been administratively denied. [facility] cannot perform this prior authorization request because prior authorization is not required for the requested drug and medication quantities above the benefit limit are excluded under the plan. For additional information on plan benefits, the member can contact Member Services by calling the number on the back of their ID card.

## 2021-04-22 NOTE — Telephone Encounter (Signed)
Notification through covermymeds that a PA was needed on patient's celebrex. PA completed and submitted; awaiting response.

## 2021-04-23 NOTE — Telephone Encounter (Signed)
In the future check goodrx and lets just have the patient use it if meds are denied.  Its like $8 at [facility].    Please go over this with pt:   [email or web address].com/[email or web address] &quantity=60&label_override=[email or web address]  ...and let me know where to send it.

## 2021-04-28 NOTE — Telephone Encounter (Signed)
LVMTRC (1st attempt)   

## 2021-08-24 ENCOUNTER — Encounter: Payer: Self-pay | Admitting: Sports Medicine

## 2021-09-14 ENCOUNTER — Telehealth: Payer: Self-pay | Admitting: Family Medicine

## 2021-09-14 NOTE — Telephone Encounter (Signed)
Patient notified paperwork is ready for pick-up.

## 2021-09-14 NOTE — Telephone Encounter (Signed)
Patient dropped off paperwork for Disability Placard. Paperwork placed in providers box. Patient informed of a possible 3-5 day turn around. lmr

## 2021-09-14 NOTE — Telephone Encounter (Signed)
Done and in my box

## 2021-09-30 DIAGNOSIS — E039 Hypothyroidism, unspecified: Secondary | ICD-10-CM | POA: Diagnosis not present

## 2021-09-30 DIAGNOSIS — I10 Essential (primary) hypertension: Secondary | ICD-10-CM | POA: Diagnosis not present

## 2021-09-30 DIAGNOSIS — R7303 Prediabetes: Secondary | ICD-10-CM | POA: Diagnosis not present

## 2021-09-30 DIAGNOSIS — E559 Vitamin D deficiency, unspecified: Secondary | ICD-10-CM | POA: Diagnosis not present

## 2021-09-30 DIAGNOSIS — J301 Allergic rhinitis due to pollen: Secondary | ICD-10-CM | POA: Diagnosis not present

## 2022-01-06 DIAGNOSIS — M79672 Pain in left foot: Secondary | ICD-10-CM | POA: Diagnosis not present

## 2022-01-06 DIAGNOSIS — L84 Corns and callosities: Secondary | ICD-10-CM | POA: Diagnosis not present

## 2022-01-06 DIAGNOSIS — B351 Tinea unguium: Secondary | ICD-10-CM | POA: Diagnosis not present

## 2022-01-06 DIAGNOSIS — R21 Rash and other nonspecific skin eruption: Secondary | ICD-10-CM | POA: Diagnosis not present

## 2022-01-06 DIAGNOSIS — I10 Essential (primary) hypertension: Secondary | ICD-10-CM | POA: Diagnosis not present

## 2022-02-18 DIAGNOSIS — M25572 Pain in left ankle and joints of left foot: Secondary | ICD-10-CM | POA: Diagnosis not present

## 2022-02-18 DIAGNOSIS — M7672 Peroneal tendinitis, left leg: Secondary | ICD-10-CM | POA: Diagnosis not present

## 2022-02-18 DIAGNOSIS — I10 Essential (primary) hypertension: Secondary | ICD-10-CM | POA: Diagnosis not present

## 2022-02-18 DIAGNOSIS — M21172 Varus deformity, not elsewhere classified, left ankle: Secondary | ICD-10-CM | POA: Diagnosis not present

## 2022-02-18 DIAGNOSIS — M7742 Metatarsalgia, left foot: Secondary | ICD-10-CM | POA: Diagnosis not present

## 2022-02-24 DIAGNOSIS — Z1231 Encounter for screening mammogram for malignant neoplasm of breast: Secondary | ICD-10-CM | POA: Diagnosis not present

## 2022-03-18 DIAGNOSIS — K219 Gastro-esophageal reflux disease without esophagitis: Secondary | ICD-10-CM | POA: Diagnosis not present

## 2022-03-18 DIAGNOSIS — I10 Essential (primary) hypertension: Secondary | ICD-10-CM | POA: Diagnosis not present

## 2022-03-18 DIAGNOSIS — Z79899 Other long term (current) drug therapy: Secondary | ICD-10-CM | POA: Diagnosis not present

## 2022-05-10 ENCOUNTER — Ambulatory Visit (INDEPENDENT_AMBULATORY_CARE_PROVIDER_SITE_OTHER): Payer: BC Managed Care – PPO | Admitting: Sports Medicine

## 2022-05-10 ENCOUNTER — Ambulatory Visit (INDEPENDENT_AMBULATORY_CARE_PROVIDER_SITE_OTHER): Payer: BC Managed Care – PPO

## 2022-05-10 ENCOUNTER — Ambulatory Visit: Payer: Self-pay | Admitting: Sports Medicine

## 2022-05-10 DIAGNOSIS — M170 Bilateral primary osteoarthritis of knee: Secondary | ICD-10-CM

## 2022-05-10 DIAGNOSIS — M79672 Pain in left foot: Secondary | ICD-10-CM | POA: Diagnosis not present

## 2022-05-10 NOTE — Assessment & Plan Note (Signed)
Pleasant 61 year old female, recurrence of knee pain, we did viscosupplementation about 2 years ago and she did well, knee pain is in the left, I injected it today. We will go ahead and get her approved for bilateral viscosupplementation, she will need updated knee x-rays.

## 2022-05-10 NOTE — Assessment & Plan Note (Signed)
Now also with chronic pain left foot, fourth metatarsal shaft, suspect early stress injury, I modified her orthotics with a new set of posting heel wedges though she also needs to wear mostly rigid shoes, will try this for about a month and if insufficient improvement we will proceed with advanced imaging and potentially immobilization with a postop shoe.

## 2022-05-10 NOTE — Progress Notes (Signed)
    Procedures performed today:    Procedure: Real-time Ultrasound Guided injection of the left knee Device: Samsung HS60  Verbal informed consent obtained.  Time-out conducted.  Noted no overlying erythema, induration, or other signs of local infection.  Skin prepped in a sterile fashion.  Local anesthesia: Topical Ethyl chloride.  With sterile technique and under real time ultrasound guidance: Mild effusion noted, 1 cc Kenalog 40, 2 cc lidocaine, 2 cc bupivacaine injected easily Completed without difficulty  Advised to call if fevers/chills, erythema, induration, drainage, or persistent bleeding.  Images permanently stored and available for review in PACS.  Impression: Technically successful ultrasound guided injection.  Independent interpretation of notes and tests performed by another provider:   None.  Brief History, Exam, Impression, and Recommendations:    Primary osteoarthritis of both knees Pleasant 61 year old female, recurrence of knee pain, we did viscosupplementation about 2 years ago and she did well, knee pain is in the left, I injected it today. We will go ahead and get her approved for bilateral viscosupplementation, she will need updated knee x-rays.  Left foot pain Now also with chronic pain left foot, fourth metatarsal shaft, suspect early stress injury, I modified her orthotics with a new set of posting heel wedges though she also needs to wear mostly rigid shoes, will try this for about a month and if insufficient improvement we will proceed with advanced imaging and potentially immobilization with a postop shoe.    ____________________________________________ Gwen Her. Dianah Field, M.D., ABFM., CAQSM., AME. Primary Care and Sports Medicine [place] [facility]  Adjunct Professor of [place] of [facility] of Medicine  Risk manager

## 2022-05-12 ENCOUNTER — Ambulatory Visit (INDEPENDENT_AMBULATORY_CARE_PROVIDER_SITE_OTHER): Payer: BC Managed Care – PPO

## 2022-05-12 DIAGNOSIS — M170 Bilateral primary osteoarthritis of knee: Secondary | ICD-10-CM | POA: Diagnosis not present

## 2022-05-12 DIAGNOSIS — M1711 Unilateral primary osteoarthritis, right knee: Secondary | ICD-10-CM | POA: Diagnosis not present

## 2022-05-12 DIAGNOSIS — S99922A Unspecified injury of left foot, initial encounter: Secondary | ICD-10-CM | POA: Diagnosis not present

## 2022-05-12 DIAGNOSIS — M1712 Unilateral primary osteoarthritis, left knee: Secondary | ICD-10-CM | POA: Diagnosis not present

## 2022-05-12 DIAGNOSIS — M79672 Pain in left foot: Secondary | ICD-10-CM | POA: Diagnosis not present

## 2022-05-23 ENCOUNTER — Encounter: Payer: Self-pay | Admitting: Sports Medicine

## 2022-05-23 NOTE — Telephone Encounter (Signed)
Called patient

## 2022-06-07 ENCOUNTER — Telehealth: Payer: Self-pay | Admitting: Sports Medicine

## 2022-06-07 ENCOUNTER — Encounter: Payer: Self-pay | Admitting: Sports Medicine

## 2022-06-07 ENCOUNTER — Ambulatory Visit: Payer: BC Managed Care – PPO | Admitting: Sports Medicine

## 2022-06-07 DIAGNOSIS — M79672 Pain in left foot: Secondary | ICD-10-CM | POA: Diagnosis not present

## 2022-06-07 DIAGNOSIS — M170 Bilateral primary osteoarthritis of knee: Secondary | ICD-10-CM

## 2022-06-07 NOTE — Telephone Encounter (Addendum)
Orthovisc approval please, bilateral, x-ray confirmed osteoarthritis, failed injections, analgesics, greater than 3 months of physician directed conservative home physical therapy.

## 2022-06-07 NOTE — Progress Notes (Addendum)
    Procedures performed today:    None.  Independent interpretation of notes and tests performed by another provider:   None.  Brief History, Exam, Impression, and Recommendations:    Primary osteoarthritis of both knees Pleasant 61 year old female, known bilateral knee osteoarthritis, did well with viscosupplementation about 2 years ago, we injected her left knee at the last visit, she has done oral analgesics, home physical therapy without significant improvement. We are getting her approved for bilateral viscosupplementation. She has failed greater than 3 months of conservative treatment. She also needs to work on her hip abductors.  Left foot pain Chronic left foot pain, fourth metatarsal shaft, suspected early stress injury, we modified her orthotics with a new set of lateral posting heel wedges. She has worn rigid shoes. Foot pain has improved considerably. Today she complains of mostly discomfort at the fifth MTP. No change in plan for now.    ____________________________________________ Gwen Her. Dianah Field, M.D., ABFM., CAQSM., AME. Primary Care and Sports Medicine [place] [facility]  Adjunct Professor of [place] of [facility] of Medicine  Risk manager

## 2022-06-07 NOTE — Assessment & Plan Note (Addendum)
Pleasant 61 year old female, known bilateral knee osteoarthritis, did well with viscosupplementation about 2 years ago, we injected her left knee at the last visit, she has done oral analgesics, home physical therapy without significant improvement. We are getting her approved for bilateral viscosupplementation. She has failed greater than 3 months of conservative treatment. She also needs to work on her hip abductors.

## 2022-06-07 NOTE — Assessment & Plan Note (Signed)
Chronic left foot pain, fourth metatarsal shaft, suspected early stress injury, we modified her orthotics with a new set of lateral posting heel wedges. She has worn rigid shoes. Foot pain has improved considerably. Today she complains of mostly discomfort at the fifth MTP. No change in plan for now.

## 2022-06-08 NOTE — Telephone Encounter (Signed)
The product is not covered under medical or pharmacy plan. Pt may qualify for [employer] direct access program. Sent msg to [employer] to proceed with qualification process.

## 2022-06-29 ENCOUNTER — Encounter: Payer: Self-pay | Admitting: Sports Medicine

## 2022-06-29 DIAGNOSIS — L820 Inflamed seborrheic keratosis: Secondary | ICD-10-CM | POA: Diagnosis not present

## 2022-06-29 DIAGNOSIS — L918 Other hypertrophic disorders of the skin: Secondary | ICD-10-CM | POA: Diagnosis not present

## 2022-06-29 DIAGNOSIS — D2239 Melanocytic nevi of other parts of face: Secondary | ICD-10-CM | POA: Diagnosis not present

## 2022-06-29 DIAGNOSIS — L821 Other seborrheic keratosis: Secondary | ICD-10-CM | POA: Diagnosis not present

## 2022-07-29 DIAGNOSIS — Z79899 Other long term (current) drug therapy: Secondary | ICD-10-CM | POA: Diagnosis not present

## 2022-07-29 DIAGNOSIS — I10 Essential (primary) hypertension: Secondary | ICD-10-CM | POA: Diagnosis not present

## 2022-07-29 DIAGNOSIS — R7303 Prediabetes: Secondary | ICD-10-CM | POA: Diagnosis not present

## 2022-07-29 DIAGNOSIS — E559 Vitamin D deficiency, unspecified: Secondary | ICD-10-CM | POA: Diagnosis not present

## 2022-08-02 DIAGNOSIS — J4520 Mild intermittent asthma, uncomplicated: Secondary | ICD-10-CM | POA: Diagnosis not present

## 2022-08-02 DIAGNOSIS — Z0000 Encounter for general adult medical examination without abnormal findings: Secondary | ICD-10-CM | POA: Diagnosis not present

## 2022-08-02 DIAGNOSIS — K219 Gastro-esophageal reflux disease without esophagitis: Secondary | ICD-10-CM | POA: Diagnosis not present

## 2022-08-02 DIAGNOSIS — I10 Essential (primary) hypertension: Secondary | ICD-10-CM | POA: Diagnosis not present

## 2022-09-14 ENCOUNTER — Telehealth: Payer: Self-pay | Admitting: Sports Medicine

## 2022-09-14 NOTE — Telephone Encounter (Signed)
Her insurance company doesn't cover any brand of the visco shots per the prior auth notes.   Since that isn't an option, PRP still remains an option.  Her osteoarthritis is severe based on Xrays so frankly it may not be worth PRP and consultation with a surgeon is the best next step.  If she does want to do PRP, its $475 per procedure and would likely need 2 or 3 per knee with maybe a little better than 50/50 chance of improvement.

## 2022-09-14 NOTE — Telephone Encounter (Signed)
Patient called stating that she needs her medication for shot that is needed for knee not sure of what but stated she needs to have it ordered before setting up an appointment.

## 2022-09-27 IMAGING — DX DG ANKLE COMPLETE 3+V*R*
3 series · 3 of 3 positions shown · non-contrast
Comparison: None.

CLINICAL DATA: Right ankle pain and weakness for 1.5 months. No
known injury.

EXAM:
RIGHT ANKLE - COMPLETE 3+ VIEW

[ankle ap]
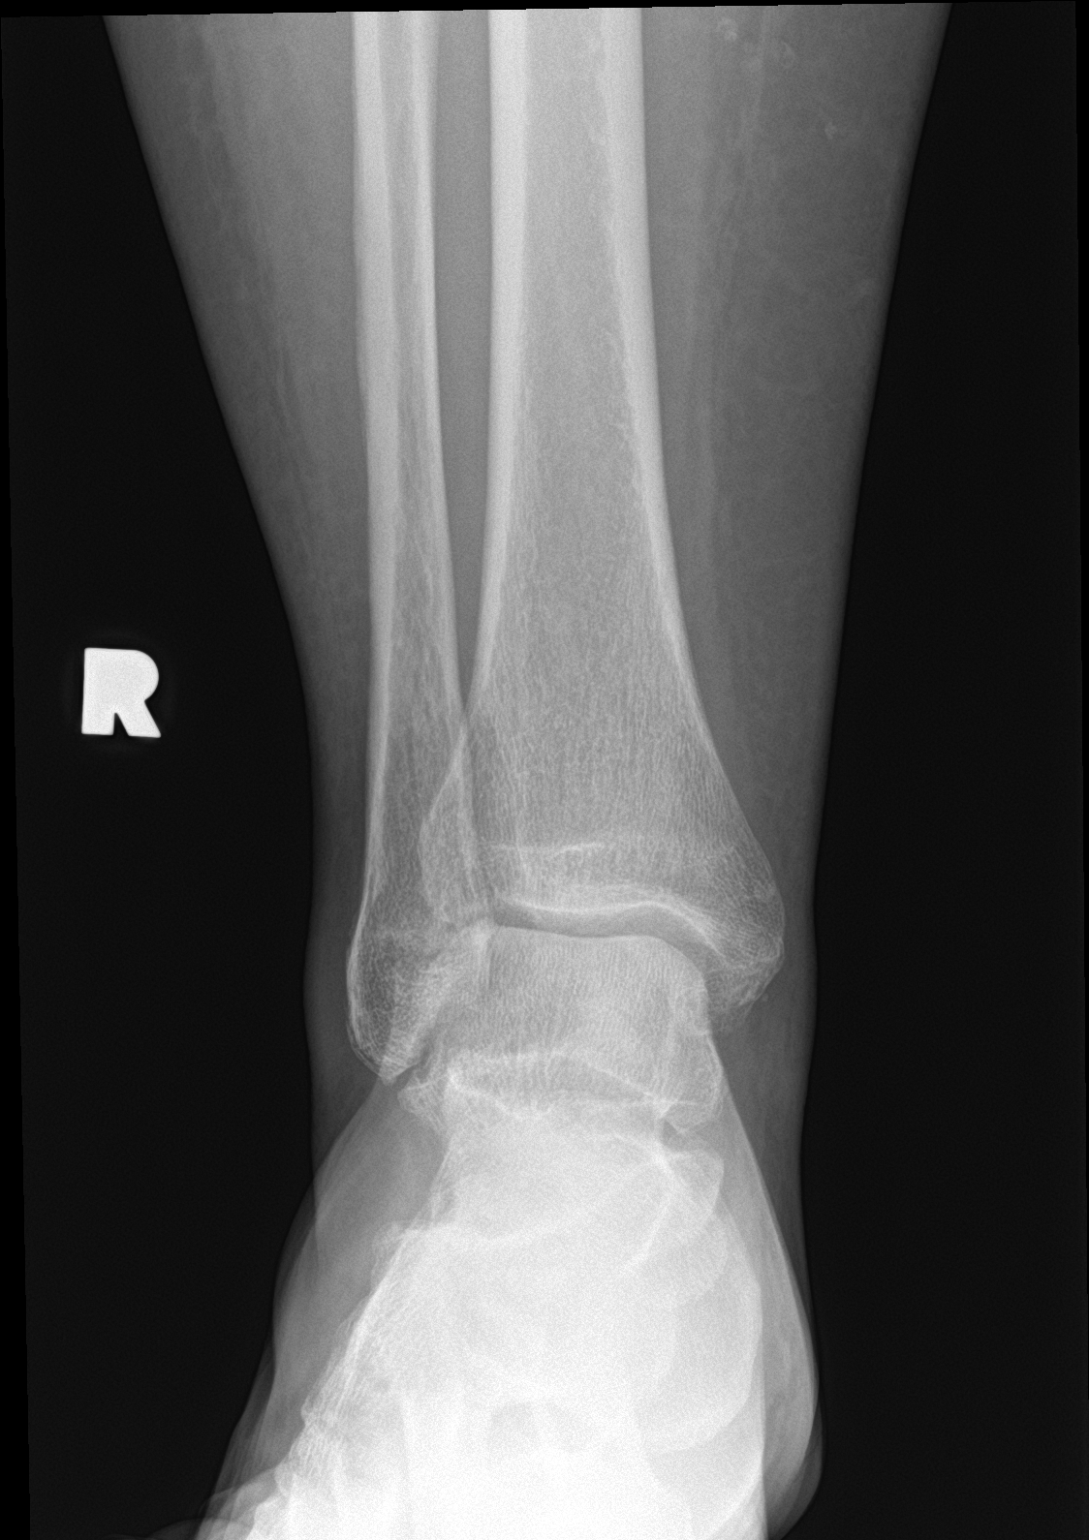

[ankle obl]
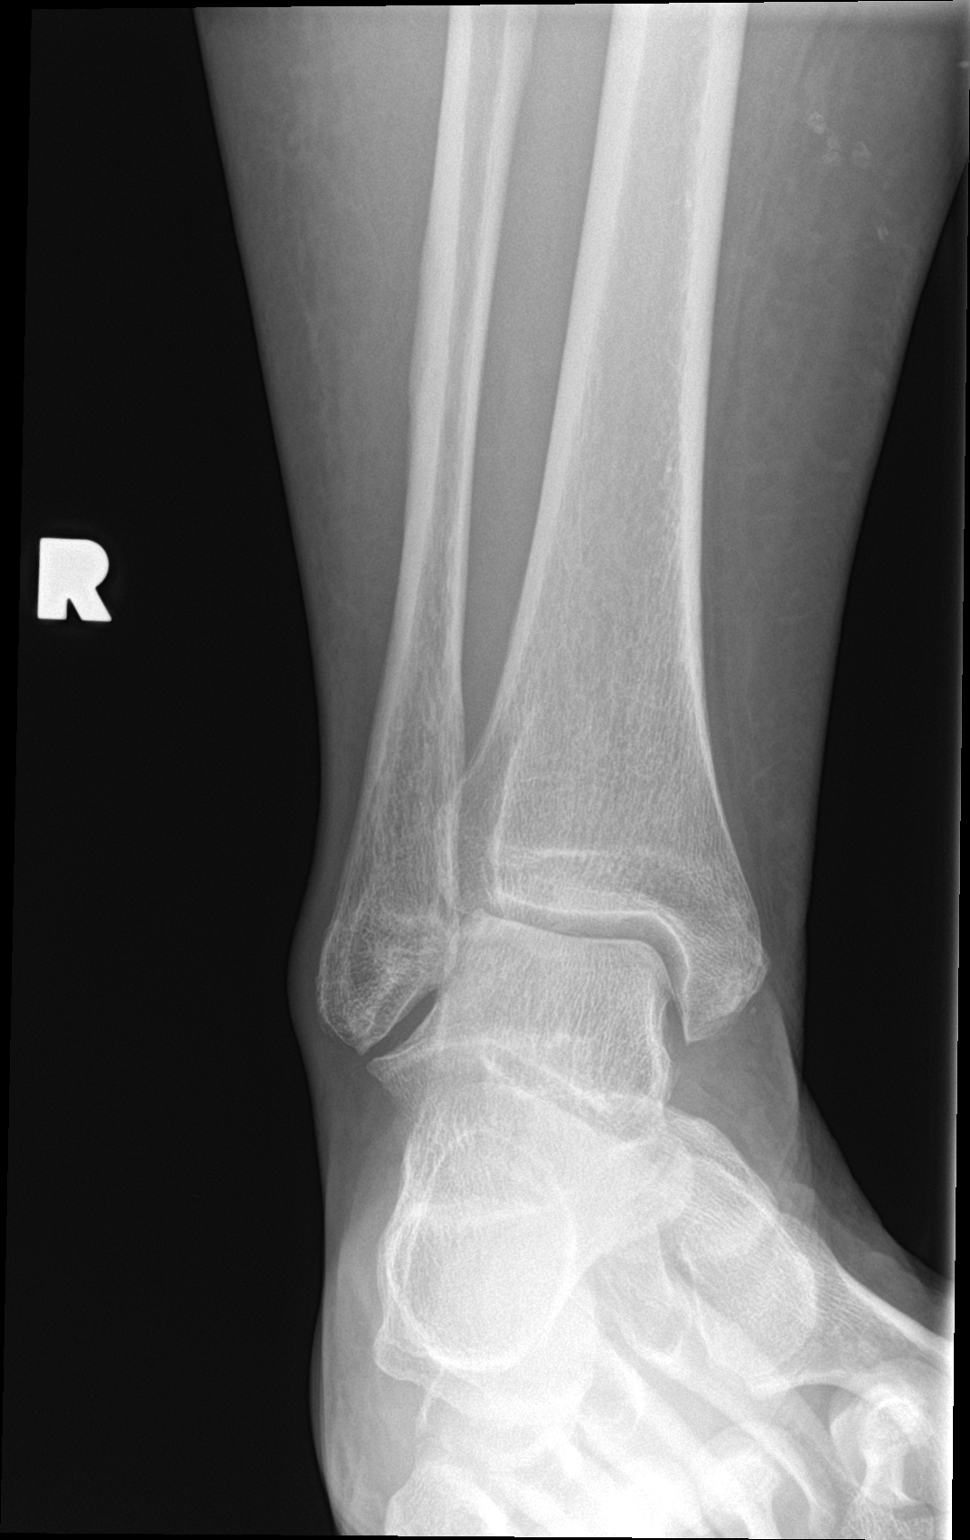

[ankle lat]
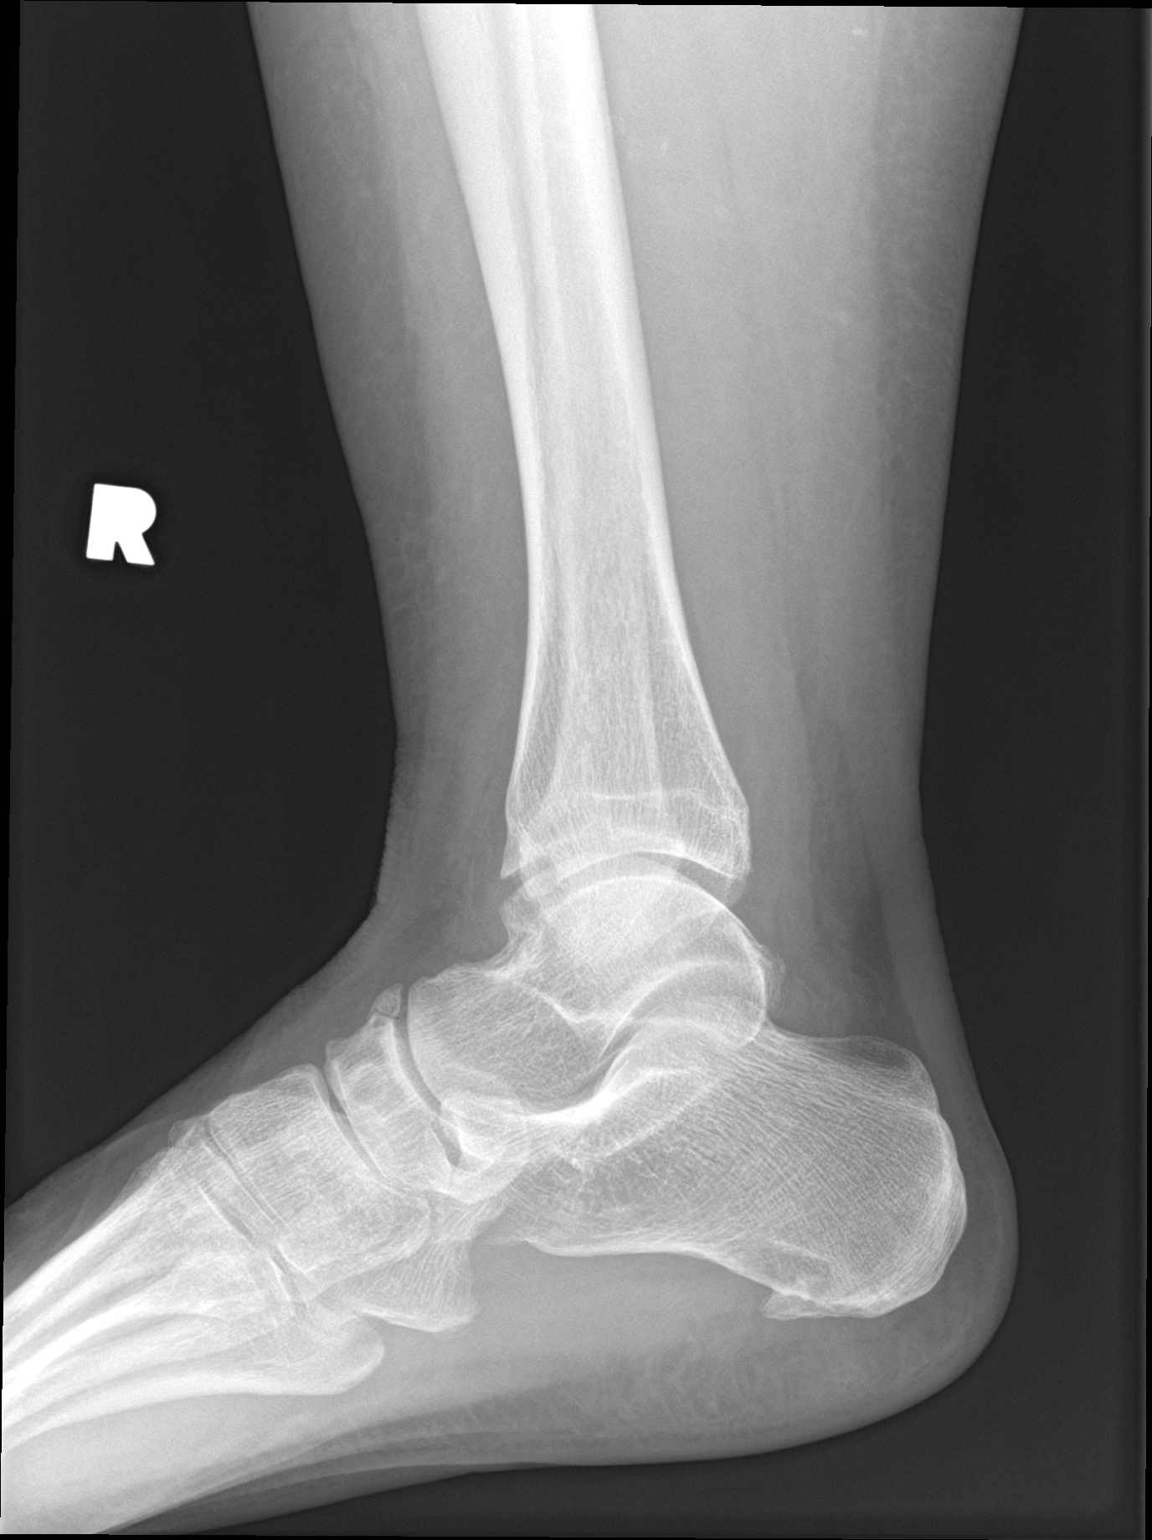

[3 of 3 positions shown; findings below may reference images not displayed]

FINDINGS: There is no evidence of fracture, dislocation, or joint effusion.
There is no evidence of arthropathy or other focal bone abnormality.
Small plantar calcaneal spur noted. Small accessory ossicle of the
dorsal margin of the navicular is seen. Soft tissues are
unremarkable.
IMPRESSION: No acute abnormality

Small plantar spur.

## 2022-09-29 ENCOUNTER — Encounter: Payer: Self-pay | Admitting: Sports Medicine

## 2022-09-29 ENCOUNTER — Telehealth: Payer: Self-pay | Admitting: Sports Medicine

## 2022-09-29 NOTE — Telephone Encounter (Signed)
Orthovisc approval please, we did this last year and it wasn't covered but patient is suspecting her plan coverages have changes this year.   Bilateral, x-ray confirmed osteoarthritis, failed injections, analgesics, greater than 3 months of physician directed conservative home physical therapy.

## 2022-09-30 NOTE — Telephone Encounter (Signed)
Hahahaha oops, you mean you don't wanna do the orthovisc!?!?!?!   Sorry!  Sending to [place].

## 2022-10-05 NOTE — Telephone Encounter (Signed)
PA information submitted via [email or web address] for Orthovisc Paperwork has been printed and given to Dr. Darene Lamer for signatures. Once obtained, information will be faxed to [employer] at [phone]

## 2022-10-19 NOTE — Telephone Encounter (Signed)
She will qualify through the [employer] direct program and its through care med and I called and they state PA has been started and will communicate with me and patient about the next steps.

## 2022-10-26 NOTE — Telephone Encounter (Signed)
Called and spoke to patient her Orthovisc is being shipped here by Friday and she has paid her Copay of $1010 so sent her the front to be scheduled.

## 2022-11-02 ENCOUNTER — Ambulatory Visit: Payer: BC Managed Care – PPO | Admitting: Sports Medicine

## 2022-11-02 ENCOUNTER — Ambulatory Visit (INDEPENDENT_AMBULATORY_CARE_PROVIDER_SITE_OTHER): Payer: BC Managed Care – PPO

## 2022-11-02 DIAGNOSIS — M170 Bilateral primary osteoarthritis of knee: Secondary | ICD-10-CM

## 2022-11-02 NOTE — Assessment & Plan Note (Addendum)
Pleasant 62 year old female known bilateral knee osteoarthritis, she did well viscosupplementation approximately 3 years ago, today we are restarting Orthovisc, return in 1 week for Orthovisc No. 2 of 4 both knees.  Patient has purchased the Orthovisc, do not bill for Orthovisc.

## 2022-11-02 NOTE — Patient Instructions (Signed)
Hip Rehabilitation Protocol:  1.  Side leg raises.  3x30 with no weight, then 3x15 with 2 lb ankle weight, then 3x15 with 5 lb ankle weight 2.  Standing hip rotation.  3x30 with no weight, then 3x15 with 2 lb ankle weight, then 3x15 with 5 lb ankle weight.  

## 2022-11-02 NOTE — Progress Notes (Signed)
    Procedures performed today:    Procedure: Real-time Ultrasound Guided injection of the left knee Device: Samsung HS60  Verbal informed consent obtained.  Time-out conducted.  Noted no overlying erythema, induration, or other signs of local infection.  Skin prepped in a sterile fashion.  Local anesthesia: Topical Ethyl chloride.  With sterile technique and under real time ultrasound guidance: Noted trace effusion, 30 mg/2 mL of OrthoVisc (sodium hyaluronate) in a prefilled syringe was injected easily into the knee through a 22-gauge needle. Completed without difficulty  Advised to call if fevers/chills, erythema, induration, drainage, or persistent bleeding.  Images permanently stored and available for review in PACS.  Impression: Technically successful ultrasound guided injection.   Procedure: Real-time Ultrasound Guided injection of the right knee Device: Samsung HS60  Verbal informed consent obtained.  Time-out conducted.  Noted no overlying erythema, induration, or other signs of local infection.  Skin prepped in a sterile fashion.  Local anesthesia: Topical Ethyl chloride.  With sterile technique and under real time ultrasound guidance: Noted trace effusion, 30 mg/2 mL of OrthoVisc (sodium hyaluronate) in a prefilled syringe was injected easily into the knee through a 22-gauge needle. Completed without difficulty  Advised to call if fevers/chills, erythema, induration, drainage, or persistent bleeding.  Images permanently stored and available for review in PACS.  Impression: Technically successful ultrasound guided injection.  Independent interpretation of notes and tests performed by another provider:   None.  Brief History, Exam, Impression, and Recommendations:    Primary osteoarthritis of both knees Pleasant 62 year old female known bilateral knee osteoarthritis, she did well viscosupplementation approximately 3 years ago, today we are restarting Orthovisc, return in  1 week for Orthovisc No. 2 of 4 both knees.  Patient has purchased the Orthovisc, do not bill for Orthovisc.    ____________________________________________ Gwen Her. Dianah Field, M.D., ABFM., CAQSM., AME. Primary Care and Sports Medicine [place] [facility]  Adjunct Professor of [place] of [facility] of Medicine  Risk manager

## 2022-11-03 ENCOUNTER — Encounter: Payer: Self-pay | Admitting: Sports Medicine

## 2022-11-03 DIAGNOSIS — M170 Bilateral primary osteoarthritis of knee: Secondary | ICD-10-CM

## 2022-11-04 MED ORDER — MELOXICAM 15 MG PO TABS: ORAL_TABLET | ORAL | 3 refills | Status: AC

## 2022-11-08 ENCOUNTER — Ambulatory Visit: Payer: BC Managed Care – PPO | Admitting: Sports Medicine

## 2022-11-08 ENCOUNTER — Other Ambulatory Visit (INDEPENDENT_AMBULATORY_CARE_PROVIDER_SITE_OTHER): Payer: BC Managed Care – PPO

## 2022-11-08 DIAGNOSIS — M170 Bilateral primary osteoarthritis of knee: Secondary | ICD-10-CM | POA: Diagnosis not present

## 2022-11-08 MED ORDER — HYALURONAN 30 MG/2ML IX SOSY
30.0000 mg | PREFILLED_SYRINGE | Freq: Once | INTRA_ARTICULAR | Status: AC
  Administered 2022-11-08: 30 mg via INTRA_ARTICULAR

## 2022-11-08 NOTE — Progress Notes (Signed)
    Procedures performed today:    Procedure: Real-time Ultrasound Guided injection of the left knee Device: Samsung HS60  Verbal informed consent obtained.  Time-out conducted.  Noted no overlying erythema, induration, or other signs of local infection.  Skin prepped in a sterile fashion.  Local anesthesia: Topical Ethyl chloride.  With sterile technique and under real time ultrasound guidance: Noted trace effusion, 30 mg/2 mL of OrthoVisc (sodium hyaluronate) in a prefilled syringe was injected easily into the knee through a 22-gauge needle. Completed without difficulty  Advised to call if fevers/chills, erythema, induration, drainage, or persistent bleeding.  Images permanently stored and available for review in PACS.  Impression: Technically successful ultrasound guided injection.   Procedure: Real-time Ultrasound Guided injection of the right knee Device: Samsung HS60  Verbal informed consent obtained.  Time-out conducted.  Noted no overlying erythema, induration, or other signs of local infection.  Skin prepped in a sterile fashion.  Local anesthesia: Topical Ethyl chloride.  With sterile technique and under real time ultrasound guidance: Noted trace effusion, 30 mg/2 mL of OrthoVisc (sodium hyaluronate) in a prefilled syringe was injected easily into the knee through a 22-gauge needle. Completed without difficulty  Advised to call if fevers/chills, erythema, induration, drainage, or persistent bleeding.  Images permanently stored and available for review in PACS.  Impression: Technically successful ultrasound guided injection.  Independent interpretation of notes and tests performed by another provider:   None.  Brief History, Exam, Impression, and Recommendations:    Primary osteoarthritis of both knees Orthovisc 2 of 4 both knees, return in 1 week for #3, do not bill for Orthovisc.    ____________________________________________ Gwen Her. Dianah Field, M.D., ABFM.,  CAQSM., AME. Primary Care and Sports Medicine [place] [facility]  Adjunct Professor of [place] of [facility] of Medicine  Risk manager

## 2022-11-08 NOTE — Assessment & Plan Note (Signed)
Orthovisc 2 of 4 both knees, return in 1 week for #3, do not bill for Orthovisc.

## 2022-11-15 ENCOUNTER — Ambulatory Visit: Payer: BC Managed Care – PPO | Admitting: Sports Medicine

## 2022-11-15 ENCOUNTER — Other Ambulatory Visit (INDEPENDENT_AMBULATORY_CARE_PROVIDER_SITE_OTHER): Payer: BC Managed Care – PPO

## 2022-11-15 DIAGNOSIS — M170 Bilateral primary osteoarthritis of knee: Secondary | ICD-10-CM

## 2022-11-15 NOTE — Progress Notes (Signed)
    Procedures performed today:    None.  Independent interpretation of notes and tests performed by another provider:   Procedure: Real-time Ultrasound Guided injection of the left knee Device: Samsung HS60  Verbal informed consent obtained.  Time-out conducted.  Noted no overlying erythema, induration, or other signs of local infection.  Skin prepped in a sterile fashion.  Local anesthesia: Topical Ethyl chloride.  With sterile technique and under real time ultrasound guidance: 30 mg/2 mL of OrthoVisc (sodium hyaluronate) in a prefilled syringe was injected easily into the knee through a 22-gauge needle. Completed without difficulty  Advised to call if fevers/chills, erythema, induration, drainage, or persistent bleeding.  Images permanently stored and available for review in PACS.  Impression: Technically successful ultrasound guided injection.   Procedure: Real-time Ultrasound Guided injection of the right knee Device: Samsung HS60  Verbal informed consent obtained.  Time-out conducted.  Noted no overlying erythema, induration, or other signs of local infection.  Skin prepped in a sterile fashion.  Local anesthesia: Topical Ethyl chloride.  With sterile technique and under real time ultrasound guidance: 30 mg/2 mL of OrthoVisc (sodium hyaluronate) in a prefilled syringe was injected easily into the knee through a 22-gauge needle.  Completed without difficulty  Advised to call if fevers/chills, erythema, induration, drainage, or persistent bleeding.  Images permanently stored and available for review in PACS.  Impression: Technically successful ultrasound guided injection.  Brief History, Exam, Impression, and Recommendations:    Primary osteoarthritis of both knees Orthovisc injection 3 of 4 both knees, return in 1 week for injection 4 of 4.    ____________________________________________ Jillian Morales. Dianah Field, M.D., ABFM., CAQSM., AME. Primary Care and Sports  Medicine [place] [facility]  Adjunct Professor of [place] of [facility] of Medicine  Risk manager

## 2022-11-15 NOTE — Assessment & Plan Note (Signed)
Orthovisc injection 3 of 4 both knees, return in 1 week for injection 4 of 4.

## 2022-11-22 ENCOUNTER — Ambulatory Visit: Payer: BC Managed Care – PPO | Admitting: Sports Medicine

## 2022-11-22 ENCOUNTER — Other Ambulatory Visit (INDEPENDENT_AMBULATORY_CARE_PROVIDER_SITE_OTHER): Payer: BC Managed Care – PPO

## 2022-11-22 DIAGNOSIS — M170 Bilateral primary osteoarthritis of knee: Secondary | ICD-10-CM | POA: Diagnosis not present

## 2022-11-22 NOTE — Progress Notes (Signed)
    Procedures performed today:    Procedure: Real-time Ultrasound Guided injection of the left knee Device: Samsung HS60  Verbal informed consent obtained.  Time-out conducted.  Noted no overlying erythema, induration, or other signs of local infection.  Skin prepped in a sterile fashion.  Local anesthesia: Topical Ethyl chloride.  With sterile technique and under real time ultrasound guidance: Noted trace effusion, 30 mg/2 mL of OrthoVisc (sodium hyaluronate) in a prefilled syringe was injected easily into the knee through a 22-gauge needle. Completed without difficulty  Advised to call if fevers/chills, erythema, induration, drainage, or persistent bleeding.  Images permanently stored and available for review in PACS.  Impression: Technically successful ultrasound guided injection.   Procedure: Real-time Ultrasound Guided injection of the right knee Device: Samsung HS60  Verbal informed consent obtained.  Time-out conducted.  Noted no overlying erythema, induration, or other signs of local infection.  Skin prepped in a sterile fashion.  Local anesthesia: Topical Ethyl chloride.  With sterile technique and under real time ultrasound guidance: Noted trace effusion, 30 mg/2 mL of OrthoVisc (sodium hyaluronate) in a prefilled syringe was injected easily into the knee through a 22-gauge needle. Completed without difficulty  Advised to call if fevers/chills, erythema, induration, drainage, or persistent bleeding.  Images permanently stored and available for review in PACS.  Impression: Technically successful ultrasound guided injection.  Independent interpretation of notes and tests performed by another provider:   None.  Brief History, Exam, Impression, and Recommendations:    Primary osteoarthritis of both knees Orthovisc No. 4 of 4 both knees, return as needed.    ____________________________________________ Gwen Her. Dianah Field, M.D., ABFM., CAQSM., AME. Primary Care and  Sports Medicine [facility]  Adjunct Professor of [place] [employer]  Risk manager

## 2022-11-22 NOTE — Assessment & Plan Note (Signed)
Orthovisc No. 4 of 4 both knees, return as needed. 

## 2024-04-23 ENCOUNTER — Encounter: Payer: Self-pay | Admitting: Sports Medicine
